# Patient Record
Sex: Male | Born: 1975 | Race: White | Hispanic: No | Marital: Married | Smoking: Never smoker
Health system: Southern US, Community
[De-identification: ages and names within clinical notes are randomized; demographics above are authoritative.]

## PROBLEM LIST (undated history)

## (undated) DIAGNOSIS — J069 Acute upper respiratory infection, unspecified: Secondary | ICD-10-CM

## (undated) DIAGNOSIS — J45909 Unspecified asthma, uncomplicated: Secondary | ICD-10-CM

## (undated) DIAGNOSIS — E789 Disorder of lipoprotein metabolism, unspecified: Secondary | ICD-10-CM

## (undated) DIAGNOSIS — G4733 Obstructive sleep apnea (adult) (pediatric): Secondary | ICD-10-CM

## (undated) DIAGNOSIS — L309 Dermatitis, unspecified: Secondary | ICD-10-CM

## (undated) DIAGNOSIS — T7840XA Allergy, unspecified, initial encounter: Secondary | ICD-10-CM

## (undated) HISTORY — DX: Acute upper respiratory infection, unspecified: J06.9

## (undated) HISTORY — PX: OTHER SURGICAL HISTORY: SHX169

## (undated) HISTORY — PX: SINOSCOPY: SHX187

## (undated) HISTORY — DX: Obstructive sleep apnea (adult) (pediatric): G47.33

## (undated) HISTORY — DX: Unspecified asthma, uncomplicated: J45.909

## (undated) HISTORY — DX: Allergy, unspecified, initial encounter: T78.40XA

## (undated) HISTORY — DX: Dermatitis, unspecified: L30.9

---

## 2015-03-08 ENCOUNTER — Encounter (INDEPENDENT_AMBULATORY_CARE_PROVIDER_SITE_OTHER): Payer: Self-pay

## 2015-03-08 ENCOUNTER — Encounter: Payer: Self-pay | Admitting: Pulmonary Disease

## 2015-03-08 ENCOUNTER — Ambulatory Visit (INDEPENDENT_AMBULATORY_CARE_PROVIDER_SITE_OTHER): Payer: 59 | Admitting: Pulmonary Disease

## 2015-03-08 ENCOUNTER — Other Ambulatory Visit: Payer: 59

## 2015-03-08 ENCOUNTER — Ambulatory Visit (INDEPENDENT_AMBULATORY_CARE_PROVIDER_SITE_OTHER)
Admission: RE | Admit: 2015-03-08 | Discharge: 2015-03-08 | Disposition: A | Discharge status: Home / Self Care | Payer: 59 | Source: Ambulatory Visit | Attending: Pulmonary Disease | Admitting: Pulmonary Disease

## 2015-03-08 VITALS — BP 122/76 | HR 87 | Temp 98.0°F | Ht 69.0 in | Wt 186.0 lb

## 2015-03-08 DIAGNOSIS — J329 Chronic sinusitis, unspecified: Secondary | ICD-10-CM

## 2015-03-08 DIAGNOSIS — J4540 Moderate persistent asthma, uncomplicated: Secondary | ICD-10-CM | POA: Insufficient documentation

## 2015-03-08 DIAGNOSIS — J4520 Mild intermittent asthma, uncomplicated: Secondary | ICD-10-CM

## 2015-03-08 NOTE — Assessment & Plan Note (Signed)
The patient has a lifelong history of asthma which has been reasonably controlled in the past. He has moved here from [place], and the past one month has seen increasing symptoms with increasing use of albuterol. He has a lot of issues with chronic sinusitis, and just recently finished a course of antibiotics. He did not have a lot of allergies while living in [place] except for dust mites, but given his history I think that we should do a RAST panel specific to [place] for further evaluation. I would like to start with the basics at this point, and I have asked the patient to take his dulera 2 puffs twice a day rather than once a day. Depending upon his Rast test results, and how he does over time, he may need more aggressive treatment for allergies. I stressed to him the importance of close follow-up with otolaryngology, given that his chronic sinus disease is often the trigger for his asthma flares. The good news here is that we are starting from a normal baseline with respect to his spirometry.

## 2015-03-08 NOTE — Progress Notes (Signed)
   Subjective:    Patient ID: Collin Cabrera, male    DOB: 1976/03/14, 39 y.o.   MRN: [id number]  HPI The patient is a 39 year old male who I've been asked to see for management of lifelong asthma. The patient recently moved here from [place], and tells me that he has had asthma since age 3. Overall, it is been fairly well-controlled over the years, and he has never been hospitalized or intubated in the past. He has maintained on dulera once a day, but the last one month he has had increasing symptoms and increased use of albuterol for rescue. He also is finishing up antibiotics for a recent sinus infection. The patient tells me that he has excellent exertional tolerance, and exercises on a regular basis. He has not had a recent chest x-ray or spirometry. He has had allergy testing on multiple occasions, with only dust mites being an issue. He has taken allergy vaccine for dust mites, and feels that it really didn't help his breathing. He has a history of chronic sinusitis, along with recurrent episodes of acute sinusitis. This is frequently a flare for his asthma. He has been diagnosed with nasal polyps, and has had surgery for treatment. He is currently scheduled to see otolaryngology in [place]. He tells me that he has 3-4 sinus infections a year, and needs by mouth prednisone about 2 times a year. He has been tried on Singulair in the past and did not feel it helped him. He denies any reflux symptoms or chronic cough. His typical albuterol use is about one time per 2 weeks.   Review of Systems  Constitutional: Negative for fever, chills, activity change, appetite change and unexpected weight change.  HENT: Positive for congestion, ear pain, postnasal drip and sore throat. Negative for dental problem, nosebleeds, rhinorrhea, sinus pressure, sneezing, trouble swallowing and voice change.   Eyes: Negative for redness, itching and visual disturbance.  Respiratory: Positive for cough, chest tightness,  shortness of breath and wheezing. Negative for choking.   Cardiovascular: Negative for chest pain, palpitations and leg swelling.  Gastrointestinal: Negative for nausea, vomiting and abdominal pain.  Genitourinary: Negative for dysuria and difficulty urinating.  Musculoskeletal: Negative for joint swelling and arthralgias.  Skin: Negative for rash.  Neurological: Negative for headaches.  Hematological: Does not bruise/bleed easily.  Psychiatric/Behavioral: Positive for dysphoric mood. Negative for behavioral problems and confusion. The patient is nervous/anxious.        Objective:   Physical Exam Constitutional:  Well developed, no acute distress  HENT:  Nares patent without discharge, large turbinates.  Oropharynx without exudate, palate and uvula are normal  Eyes:  Perrla, eomi, no scleral icterus  Neck:  No JVD, no TMG  Cardiovascular:  Normal rate, regular rhythm, no rubs or gallops.  No murmurs        Intact distal pulses  Pulmonary :  Normal breath sounds, no stridor or respiratory distress   No rales, rhonchi, or wheezing  Abdominal:  Soft, nondistended, bowel sounds present.  No tenderness noted.   Musculoskeletal:  No lower extremity edema noted.  Lymph Nodes:  No cervical lymphadenopathy noted  Skin:  No cyanosis noted  Neurologic:  Alert, appropriate, moves all 4 extremities without obvious deficit.         Assessment & Plan:

## 2015-03-08 NOTE — Patient Instructions (Signed)
Increase dulera to 2 puffs am and pm everyday. Keep close followup with your ENT for your chronic sinus disease. Will check chest xray today as well as RAST testing for allergies.  Will call you with results. Would like to see you back in 88mos just to see how things are going, then can spread out to every 37mos.  However, would like to hear from you if not doing a lot better with increased dulera dosing.

## 2015-03-08 NOTE — Assessment & Plan Note (Signed)
The patient has a history of chronic sinusitis with recurrent episodes of acute sinusitis. This is normally the trigger for his acute asthma flares. He is scheduled to see an otolaryngologist in [place], and I have stressed to him the importance of keeping close contact with him.

## 2015-03-09 ENCOUNTER — Telehealth: Payer: Self-pay | Admitting: Pulmonary Disease

## 2015-03-09 LAB — ALLERGY FULL PROFILE
Allergen, D pternoyssinus,d7: 7.65 kU/L — ABNORMAL HIGH
Allergen,Goose feathers, e70: <0.1 kU/L
Alternaria Alternata: <0.1 kU/L
Aspergillus fumigatus, m3: <0.1 kU/L
Bermuda Grass: <0.1 kU/L
Candida Albicans: 0.1 kU/L — ABNORMAL HIGH
Common Ragweed: <0.1 kU/L
D. farinae: 5.74 kU/L — ABNORMAL HIGH
Dog Dander: <0.1 kU/L
G009 Red Top: <0.1 kU/L
Helminthosporium halodes: <0.1 kU/L
House Dust Hollister: 0.39 kU/L — ABNORMAL HIGH
IgE (Immunoglobulin E), Serum: 63 kU/L (ref <115)
Lamb's Quarters: <0.1 kU/L
Plantain: <0.1 kU/L
Stemphylium Botryosum: <0.1 kU/L
Sycamore Tree: <0.1 kU/L

## 2015-03-09 NOTE — Telephone Encounter (Signed)
Notes Recorded by Kathee Delton, MD on 03/09/2015 at 3:22 PM Let pt know that his allergy testing only showed significant reactions to dust mites. His IgE level was also fairly low, which looks at "allergy level" of the body overall.  Would see how he does on the higher dose of dulera. If issues continue, we can look at other things ---------------------------------------- Spoke with pt, he is aware of results.

## 2015-03-09 NOTE — Progress Notes (Signed)
Quick Note:  Pt aware of results. ______ 

## 2015-05-08 ENCOUNTER — Encounter: Payer: Self-pay | Admitting: Pulmonary Disease

## 2015-05-08 ENCOUNTER — Ambulatory Visit (INDEPENDENT_AMBULATORY_CARE_PROVIDER_SITE_OTHER): Payer: 59 | Admitting: Pulmonary Disease

## 2015-05-08 VITALS — BP 114/70 | HR 60 | Temp 98.0°F | Ht 69.0 in | Wt 169.2 lb

## 2015-05-08 DIAGNOSIS — J4540 Moderate persistent asthma, uncomplicated: Secondary | ICD-10-CM

## 2015-05-08 NOTE — Patient Instructions (Signed)
Stay on dulera, but can increase dose to twice a day if having a bad stretch. followup in one year with Dr. Lake Bells.

## 2015-05-08 NOTE — Progress Notes (Signed)
   Subjective:    Patient ID: Collin Cabrera, male    DOB: Jun 19, 1976, 39 y.o.   MRN: [id number]  HPI The patient comes in today for follow-up of his known asthma. He has been taking dulera on a daily basis, but only one time a day. He feels that his breathing is doing very well currently, and he has not required a rescue inhaler. I have reviewed his Rast testing with him, and surprisingly he only had a reaction to dust mites with a low IgE level. He does have a history of chronic sinusitis, and is followed by otolaryngology.  He is currently very satisfied with his asthma control.   Review of Systems  Constitutional: Negative for fever and unexpected weight change.  HENT: Negative for congestion, dental problem, ear pain, nosebleeds, postnasal drip, rhinorrhea, sinus pressure, sneezing, sore throat and trouble swallowing.   Eyes: Negative for redness and itching.  Respiratory: Negative for cough, chest tightness, shortness of breath and wheezing.   Cardiovascular: Negative for palpitations and leg swelling.  Gastrointestinal: Negative for nausea and vomiting.  Genitourinary: Negative for dysuria.  Musculoskeletal: Negative for joint swelling.  Skin: Negative for rash.  Neurological: Negative for headaches.  Hematological: Does not bruise/bleed easily.  Psychiatric/Behavioral: Negative for dysphoric mood. The patient is not nervous/anxious.        Objective:   Physical Exam Well-developed male in no acute distress Nose without purulence or discharge noted Neck without lymphadenopathy or thyromegaly Chest totally clear to auscultation, no wheezing Cardiac exam with regular rate and rhythm Lower extremities without edema, no cyanosis Alert and oriented, moves all 4 extremities.        Assessment & Plan:

## 2015-05-08 NOTE — Assessment & Plan Note (Signed)
The patient currently is doing very well on once a day dulera, and is satisfied with his current control. I am okay with him staying on once a day medication provided that he is doing well. He understands that he can increase this to twice a day if having increased symptoms. He will follow-up in one year if doing well.

## 2015-07-02 ENCOUNTER — Telehealth: Payer: Self-pay | Admitting: Pulmonary Disease

## 2015-07-02 NOTE — Telephone Encounter (Signed)
Rec'd records from [facility] [place] [facility] page to [facility]

## 2017-02-12 DIAGNOSIS — J209 Acute bronchitis, unspecified: Secondary | ICD-10-CM | POA: Diagnosis not present

## 2017-02-12 DIAGNOSIS — J019 Acute sinusitis, unspecified: Secondary | ICD-10-CM | POA: Diagnosis not present

## 2017-02-17 ENCOUNTER — Encounter: Payer: Self-pay | Admitting: Allergy and Immunology

## 2017-02-17 ENCOUNTER — Ambulatory Visit (INDEPENDENT_AMBULATORY_CARE_PROVIDER_SITE_OTHER): Payer: 59 | Admitting: Allergy and Immunology

## 2017-02-17 VITALS — BP 112/78 | HR 64 | Temp 98.4°F | Resp 16 | Ht 68.5 in | Wt 183.0 lb

## 2017-02-17 DIAGNOSIS — J45901 Unspecified asthma with (acute) exacerbation: Secondary | ICD-10-CM

## 2017-02-17 DIAGNOSIS — J3089 Other allergic rhinitis: Secondary | ICD-10-CM | POA: Diagnosis not present

## 2017-02-17 DIAGNOSIS — J320 Chronic maxillary sinusitis: Secondary | ICD-10-CM

## 2017-02-17 DIAGNOSIS — Z8709 Personal history of other diseases of the respiratory system: Secondary | ICD-10-CM | POA: Diagnosis not present

## 2017-02-17 MED ORDER — PREDNISONE 1 MG PO TABS
10.0000 mg | ORAL_TABLET | Freq: Every day | ORAL | Status: DC
Start: 2017-02-18 — End: 2019-03-22

## 2017-02-17 MED ORDER — BECLOMETHASONE DIPROPIONATE 80 MCG/ACT NA AERS: 1.0000 | INHALATION_SPRAY | Freq: Every day | NASAL | 5 refills | Status: DC | PRN

## 2017-02-17 NOTE — Assessment & Plan Note (Addendum)
Status post polypectomy.  Qnasl has been prescribed (as above).

## 2017-02-17 NOTE — Progress Notes (Signed)
New Patient Note  RE: Collin Cabrera MRN: [id number] DOB: Mar 06, 1976 Date of Office Visit: 02/17/2017  Referring provider: Barrie Lyme, FNP Primary care provider: Barrie Lyme, FNP  Chief Complaint: Asthma; Allergic Rhinitis ; and Sinus Problem   History of present illness: Collin Cabrera is a 41 y.o. male seen today in consultation requested by Gwenlyn Perking, FNP. He reports that he has had asthma and allergic rhinitis since he was approximately 41 years of age.  Ten years ago he started aeroallergen immunotherapy, primarily for dust mite hypersensitivity, and he reports that his upper and lower respiratory symptoms improved significantly.  He moved from [place] to [place] 3 years ago and states that his asthma and allergy symptoms have been relatively well-controlled until approximately 1 month ago.  At that time, he developed a sinus infection requiring azithromycin and prednisone.  His sinus symptoms improved, however he is still experiencing coughing, dyspnea, and chest tightness.  He has been requiring albuterol rescue on a daily basis as well as having nocturnal awakenings due to lower respiratory symptoms over the past 2 weeks.  He notes that he acquired a dog 1 month ago.  He does not allow the dog in the bedroom but is concerned that he may be allergic to dog dander and would like confirmation.  He currently takes Dulera 200-5 g, 2 inhalations once daily, albuterol as needed, levocetirizine daily, and fluticasone nasal spray as needed.  He currently does not use a spacer device with his HFA inhalers. He has a history of nasal polyps, status post polypectomy a few year ago. He currently does not take an intranasal steroid on a daily basis.   Assessment and plan: Asthma with acute exacerbation  Prednisone has been provided, 40 mg x3 days, 20 mg x1 day, 10 mg x1 day, then stop.  For now, increase Dulera 200-5 g to 2 inhalations twice a day.  To maximize pulmonary  deposition, a spacer has been provided along with instructions for its proper administration with an HFA inhaler.  Continue albuterol HFA, 1-2 inhalations every 4-6 hours as needed.  The patient has been asked to contact me if his symptoms persist or progress.  Other allergic rhinitis We were unable to perform skin tests today due to recent administration of antihistamine.   The patient is scheduled to return in 3 weeks for allergy skin testing after having been off of antihistamines for at least 3 days.    To control symptoms while off of antihistamines, prednisone has been provided: 10g per day for 3 days.  A sample and prescription have been provided for Qnasl 80 g, one actuation per nostril twice daily as needed.  Proper technique has been discussed and demonstrated.  I have also recommended nasal saline spray (i.e., Simply Saline) or nasal saline lavage (i.e., NeilMed) as needed prior to medicated nasal sprays.  Further recommendations will be made at that time based upon skin test results.  Sinusitis, chronic  Treatment plan as outlined above for allergic rhinitis.  For thick post nasal drainage, nasal congestion, and/or sinus pressure, add guaifenesin [phone] mg (Mucinex) plus/minus pseudoephedrine 60-120 mg  twice daily as needed with adequate hydration as discussed. Pseudoephedrine is only to be used for short-term relief of nasal/sinus congestion. Long-term use is discouraged due to potential side effects.  History of nasal polyp Status post polypectomy.  Qnasl has been prescribed (as above).   Meds ordered this encounter  Medications  . Beclomethasone Dipropionate (QNASL) 80 MCG/ACT  AERS    Sig: Place 1 spray into both nostrils daily as needed.    Dispense:  8.7 g    Refill:  5  . predniSONE (DELTASONE) tablet 10 mg    Diagnostics: Spirometry: FVC was 4.88 L and FEV1 was 4.19 L but 180 mL post-bronchodilator improvement.This study was performed while the patient  was relatively asymptomatic.  Please see scanned spirometry results for details.    Physical examination: Blood pressure 112/78, pulse 64, temperature 98.4 F (36.9 C), temperature source Oral, resp. rate 16, height 5' 8.5" (1.74 m), weight 183 lb (83 kg), SpO2 96 %.  General: Alert, interactive, in no acute distress. HEENT: TMs pearly gray, turbinates moderately edematous without discharge, post-pharynx moderately erythematous. Neck: Supple without lymphadenopathy. Lungs: Clear to auscultation without wheezing, rhonchi or rales. CV: Normal S1, S2 without murmurs. Abdomen: Nondistended, nontender. Skin: Warm and dry, without lesions or rashes. Extremities:  No clubbing, cyanosis or edema. Neuro:   Grossly intact.  Review of systems:  Review of systems negative except as noted in HPI / PMHx or noted below: Review of Systems  Constitutional: Negative.   HENT: Negative.   Eyes: Negative.   Respiratory: Negative.   Cardiovascular: Negative.   Gastrointestinal: Negative.   Genitourinary: Negative.   Musculoskeletal: Negative.   Skin: Negative.   Neurological: Negative.   Endo/Heme/Allergies: Negative.   Psychiatric/Behavioral: Negative.     Past medical history:  Past Medical History:  Diagnosis Date  . Allergy   . Asthma   . Eczema   . OSA (obstructive sleep apnea)   . Recurrent upper respiratory infection (URI)     Past surgical history:  Past Surgical History:  Procedure Laterality Date  . nasal polyps      Family history: Family History  Problem Relation Age of Onset  . Cancer Father   . Allergic rhinitis Neg Hx   . Asthma Neg Hx   . Eczema Neg Hx   . Urticaria Neg Hx   . Immunodeficiency Neg Hx     Social history: Social History   Social History  . Marital status: Unknown    Spouse name: N/A  . Number of children: 2  . Years of education: N/A   Occupational History  . unemployed    Social History Main Topics  . Smoking status: Never Smoker  .  Smokeless tobacco: Never Used  . Alcohol use 2.4 oz/week    4 Glasses of wine per week  . Drug use: No  . Sexual activity: Not on file   Other Topics Concern  . Not on file   Social History Narrative  . No narrative on file   Environmental History: The patient lives in a 40 year old house with carpeting in the bedroom, gas heat, and central air.  He has had a dog for the past month does not allow the dog in to his bedroom.  He is a nonsmoker.  Allergies as of 02/17/2017      Reactions   Dust Mite Extract       Medication List       Accurate as of 02/17/17  5:41 PM. Always use your most recent med list.          Beclomethasone Dipropionate 80 MCG/ACT Aers Commonly known as:  QNASL Place 1 spray into both nostrils daily as needed.   diazepam 5 MG tablet Commonly known as:  VALIUM Take 1 tablet by mouth daily as needed.   DULERA 200-5 MCG/ACT Aero Generic drug:  mometasone-formoterol 2 puffs daily.   fluticasone 50 MCG/ACT nasal spray Commonly known as:  FLONASE Place 1 spray into both nostrils daily.   levocetirizine 5 MG tablet Commonly known as:  XYZAL Once daily   PROAIR HFA 108 (90 Base) MCG/ACT inhaler Generic drug:  albuterol 2 puffs every 6 hrs prn   traZODone 50 MG tablet Commonly known as:  DESYREL Take 50 mg by mouth at bedtime.       Known medication allergies: Allergies  Allergen Reactions  . Dust Mite Extract     I appreciate the opportunity to take part in Mike's care. Please do not hesitate to contact me with questions.  Sincerely,   R. Edgar Frisk, MD

## 2017-02-17 NOTE — Assessment & Plan Note (Signed)
   Treatment plan as outlined above for allergic rhinitis.  For thick post nasal drainage, nasal congestion, and/or sinus pressure, add guaifenesin [phone] mg (Mucinex) plus/minus pseudoephedrine 60-120 mg  twice daily as needed with adequate hydration as discussed. Pseudoephedrine is only to be used for short-term relief of nasal/sinus congestion. Long-term use is discouraged due to potential side effects.

## 2017-02-17 NOTE — Assessment & Plan Note (Signed)
We were unable to perform skin tests today due to recent administration of antihistamine.   The patient is scheduled to return in 3 weeks for allergy skin testing after having been off of antihistamines for at least 3 days.    To control symptoms while off of antihistamines, prednisone has been provided: 10g per day for 3 days.  A sample and prescription have been provided for Qnasl 80 g, one actuation per nostril twice daily as needed.  Proper technique has been discussed and demonstrated.  I have also recommended nasal saline spray (i.e., Simply Saline) or nasal saline lavage (i.e., NeilMed) as needed prior to medicated nasal sprays.  Further recommendations will be made at that time based upon skin test results.

## 2017-02-17 NOTE — Patient Instructions (Addendum)
Asthma with acute exacerbation  Prednisone has been provided, 40 mg x3 days, 20 mg x1 day, 10 mg x1 day, then stop.  For now, increase Dulera 200-5 g to 2 inhalations twice a day.  To maximize pulmonary deposition, a spacer has been provided along with instructions for its proper administration with an HFA inhaler.  Continue albuterol HFA, 1-2 inhalations every 4-6 hours as needed.  The patient has been asked to contact me if his symptoms persist or progress.  Other allergic rhinitis We were unable to perform skin tests today due to recent administration of antihistamine.   The patient is scheduled to return in 3 weeks for allergy skin testing after having been off of antihistamines for at least 3 days.    To control symptoms while off of antihistamines, prednisone has been provided: 10g per day for 3 days.  A sample and prescription have been provided for Qnasl 80 g, one actuation per nostril twice daily as needed.  Proper technique has been discussed and demonstrated.  I have also recommended nasal saline spray (i.e., Simply Saline) or nasal saline lavage (i.e., NeilMed) as needed prior to medicated nasal sprays.  Further recommendations will be made at that time based upon skin test results.  Sinusitis, chronic  Treatment plan as outlined above for allergic rhinitis.  For thick post nasal drainage, nasal congestion, and/or sinus pressure, add guaifenesin [phone] mg (Mucinex) plus/minus pseudoephedrine 60-120 mg  twice daily as needed with adequate hydration as discussed. Pseudoephedrine is only to be used for short-term relief of nasal/sinus congestion. Long-term use is discouraged due to potential side effects.  History of nasal polyp Status post polypectomy.  Qnasl has been prescribed (as above).   Return in about 3 weeks (around 03/10/2017) for allergy skin testing.  Control of Dog or Cat Allergen  Avoidance is the best way to manage a dog or cat allergy. If you have  a dog or cat and are allergic to dog or cats, consider removing the dog or cat from the home. If you have a dog or cat but don't want to find it a new home, or if your family wants a pet even though someone in the household is allergic, here are some strategies that may help keep symptoms at bay:  1. Keep the pet out of your bedroom and restrict it to only a few rooms. Be advised that keeping the dog or cat in only one room will not limit the allergens to that room. 2. Don't pet, hug or kiss the dog or cat; if you do, wash your hands with soap and water. 3. High-efficiency particulate air (HEPA) cleaners run continuously in a bedroom or living room can reduce allergen levels over time. 4. Regular use of a high-efficiency vacuum cleaner or a central vacuum can reduce allergen levels. 5. Giving your dog or cat a bath at least once a week can reduce airborne allergen.  Control of House Dust Mite Allergen  House dust mites play a major role in allergic asthma and rhinitis.  They occur in environments with high humidity wherever human skin, the food for dust mites is found. High levels have been detected in dust obtained from mattresses, pillows, carpets, upholstered furniture, bed covers, clothes and soft toys.  The principal allergen of the house dust mite is found in its feces.  A gram of dust may contain 1,000 mites and 250,000 fecal particles.  Mite antigen is easily measured in the air during house cleaning activities.  1. Encase mattresses, including the box spring, and pillow, in an air tight cover.  Seal the zipper end of the encased mattresses with wide adhesive tape. 2. Wash the bedding in water of 130 degrees Farenheit weekly.  Avoid cotton comforters/quilts and flannel bedding: the most ideal bed covering is the dacron comforter. 3. Remove all upholstered furniture from the bedroom. 4. Remove carpets, carpet padding, rugs, and non-washable window drapes from the bedroom.  Wash drapes  weekly or use plastic window coverings. 5. Remove all non-washable stuffed toys from the bedroom.  Wash stuffed toys weekly. 6. Have the room cleaned frequently with a vacuum cleaner and a damp dust-mop.  The patient should not be in a room which is being cleaned and should wait 1 hour after cleaning before going into the room. 7. Close and seal all heating outlets in the bedroom.  Otherwise, the room will become filled with dust-laden air.  An electric heater can be used to heat the room. 8. Reduce indoor humidity to less than 50%.  Do not use a humidifier.

## 2017-02-17 NOTE — Assessment & Plan Note (Signed)
   Prednisone has been provided, 40 mg x3 days, 20 mg x1 day, 10 mg x1 day, then stop.  For now, increase Dulera 200-5 g to 2 inhalations twice a day.  To maximize pulmonary deposition, a spacer has been provided along with instructions for its proper administration with an HFA inhaler.  Continue albuterol HFA, 1-2 inhalations every 4-6 hours as needed.  The patient has been asked to contact me if his symptoms persist or progress.

## 2017-02-19 ENCOUNTER — Telehealth: Payer: Self-pay

## 2017-02-19 MED ORDER — CICLESONIDE 37 MCG/ACT NA AERS: 1.0000 | INHALATION_SPRAY | Freq: Every day | NASAL | 5 refills | Status: DC | PRN

## 2017-02-19 NOTE — Telephone Encounter (Signed)
We received a fax from [facility] in [facility] asking for an alternative Qnasl 80.  Please Advise.

## 2017-02-19 NOTE — Telephone Encounter (Signed)
How about Collin Cabrera?

## 2017-02-19 NOTE — Addendum Note (Signed)
Addended by: Martyn Malay on: 02/19/2017 01:27 PM   Modules accepted: Orders

## 2017-02-19 NOTE — Telephone Encounter (Signed)
Script sent in

## 2017-02-20 ENCOUNTER — Telehealth: Payer: Self-pay | Admitting: *Deleted

## 2017-02-20 NOTE — Telephone Encounter (Signed)
error 

## 2017-06-03 DIAGNOSIS — K219 Gastro-esophageal reflux disease without esophagitis: Secondary | ICD-10-CM | POA: Diagnosis not present

## 2017-07-18 DIAGNOSIS — R140 Abdominal distension (gaseous): Secondary | ICD-10-CM | POA: Diagnosis not present

## 2017-07-18 DIAGNOSIS — G479 Sleep disorder, unspecified: Secondary | ICD-10-CM | POA: Diagnosis not present

## 2017-08-05 DIAGNOSIS — R110 Nausea: Secondary | ICD-10-CM | POA: Diagnosis not present

## 2017-08-05 DIAGNOSIS — R1030 Lower abdominal pain, unspecified: Secondary | ICD-10-CM | POA: Diagnosis not present

## 2017-08-05 DIAGNOSIS — K219 Gastro-esophageal reflux disease without esophagitis: Secondary | ICD-10-CM | POA: Diagnosis not present

## 2017-08-06 ENCOUNTER — Other Ambulatory Visit: Payer: Self-pay | Admitting: Family Medicine

## 2017-08-06 DIAGNOSIS — Z23 Encounter for immunization: Secondary | ICD-10-CM | POA: Diagnosis not present

## 2017-08-06 DIAGNOSIS — R1030 Lower abdominal pain, unspecified: Secondary | ICD-10-CM

## 2017-08-13 ENCOUNTER — Ambulatory Visit (HOSPITAL_BASED_OUTPATIENT_CLINIC_OR_DEPARTMENT_OTHER)
Admission: RE | Admit: 2017-08-13 | Discharge: 2017-08-13 | Disposition: A | Discharge status: Home / Self Care | Payer: 59 | Source: Ambulatory Visit | Attending: Family Medicine | Admitting: Family Medicine

## 2017-08-13 ENCOUNTER — Encounter (HOSPITAL_BASED_OUTPATIENT_CLINIC_OR_DEPARTMENT_OTHER): Payer: Self-pay

## 2017-08-13 DIAGNOSIS — R1030 Lower abdominal pain, unspecified: Secondary | ICD-10-CM | POA: Diagnosis present

## 2017-08-13 DIAGNOSIS — R109 Unspecified abdominal pain: Secondary | ICD-10-CM | POA: Diagnosis not present

## 2017-08-13 MED ORDER — IOPAMIDOL (ISOVUE-300) INJECTION 61%
100.0000 mL | Freq: Once | INTRAVENOUS | Status: AC | PRN
  Administered 2017-08-13: 100 mL via INTRAVENOUS

## 2017-09-01 DIAGNOSIS — R110 Nausea: Secondary | ICD-10-CM | POA: Diagnosis not present

## 2017-09-01 DIAGNOSIS — R198 Other specified symptoms and signs involving the digestive system and abdomen: Secondary | ICD-10-CM | POA: Diagnosis not present

## 2017-09-01 DIAGNOSIS — R140 Abdominal distension (gaseous): Secondary | ICD-10-CM | POA: Diagnosis not present

## 2017-10-06 DIAGNOSIS — Z23 Encounter for immunization: Secondary | ICD-10-CM | POA: Diagnosis not present

## 2017-10-18 DIAGNOSIS — K056 Periodontal disease, unspecified: Secondary | ICD-10-CM | POA: Diagnosis not present

## 2018-03-13 DIAGNOSIS — J112 Influenza due to unidentified influenza virus with gastrointestinal manifestations: Secondary | ICD-10-CM | POA: Diagnosis not present

## 2018-03-15 DIAGNOSIS — Z91018 Allergy to other foods: Secondary | ICD-10-CM | POA: Diagnosis not present

## 2018-03-15 DIAGNOSIS — R748 Abnormal levels of other serum enzymes: Secondary | ICD-10-CM | POA: Diagnosis not present

## 2018-03-18 ENCOUNTER — Other Ambulatory Visit: Payer: Self-pay | Admitting: Family Medicine

## 2018-03-18 DIAGNOSIS — R109 Unspecified abdominal pain: Secondary | ICD-10-CM

## 2018-03-18 DIAGNOSIS — R748 Abnormal levels of other serum enzymes: Secondary | ICD-10-CM

## 2018-03-20 DIAGNOSIS — J019 Acute sinusitis, unspecified: Secondary | ICD-10-CM | POA: Diagnosis not present

## 2018-03-21 ENCOUNTER — Ambulatory Visit (HOSPITAL_BASED_OUTPATIENT_CLINIC_OR_DEPARTMENT_OTHER): Payer: 59

## 2018-03-22 ENCOUNTER — Other Ambulatory Visit (HOSPITAL_BASED_OUTPATIENT_CLINIC_OR_DEPARTMENT_OTHER): Payer: 59

## 2018-03-23 ENCOUNTER — Ambulatory Visit (HOSPITAL_BASED_OUTPATIENT_CLINIC_OR_DEPARTMENT_OTHER): Payer: 59

## 2018-03-25 ENCOUNTER — Encounter (HOSPITAL_BASED_OUTPATIENT_CLINIC_OR_DEPARTMENT_OTHER): Payer: Self-pay

## 2018-03-25 ENCOUNTER — Ambulatory Visit (HOSPITAL_BASED_OUTPATIENT_CLINIC_OR_DEPARTMENT_OTHER)
Admission: RE | Admit: 2018-03-25 | Discharge: 2018-03-25 | Disposition: A | Discharge status: Home / Self Care | Payer: 59 | Source: Ambulatory Visit | Attending: Family Medicine | Admitting: Family Medicine

## 2018-03-25 DIAGNOSIS — R748 Abnormal levels of other serum enzymes: Secondary | ICD-10-CM | POA: Diagnosis present

## 2018-03-25 DIAGNOSIS — R7989 Other specified abnormal findings of blood chemistry: Secondary | ICD-10-CM | POA: Diagnosis not present

## 2018-03-25 DIAGNOSIS — R109 Unspecified abdominal pain: Secondary | ICD-10-CM | POA: Insufficient documentation

## 2018-03-25 HISTORY — DX: Disorder of lipoprotein metabolism, unspecified: E78.9

## 2018-09-06 DIAGNOSIS — Z3009 Encounter for other general counseling and advice on contraception: Secondary | ICD-10-CM | POA: Diagnosis not present

## 2018-11-06 DIAGNOSIS — J019 Acute sinusitis, unspecified: Secondary | ICD-10-CM | POA: Diagnosis not present

## 2018-12-09 ENCOUNTER — Other Ambulatory Visit (HOSPITAL_BASED_OUTPATIENT_CLINIC_OR_DEPARTMENT_OTHER): Payer: Self-pay | Admitting: Family Medicine

## 2018-12-09 DIAGNOSIS — D1803 Hemangioma of intra-abdominal structures: Secondary | ICD-10-CM

## 2019-01-12 IMAGING — US US ABDOMEN COMPLETE
1 series · 13 of 25 positions shown · non-contrast
Comparison: CT 08/13/2017

CLINICAL DATA: Elevated liver function tests.

EXAM:
ABDOMEN ULTRASOUND COMPLETE

[Series 1: us abdomen complete · 0.31mm/px · 13 of 115 slices shown]
[im 1/115]
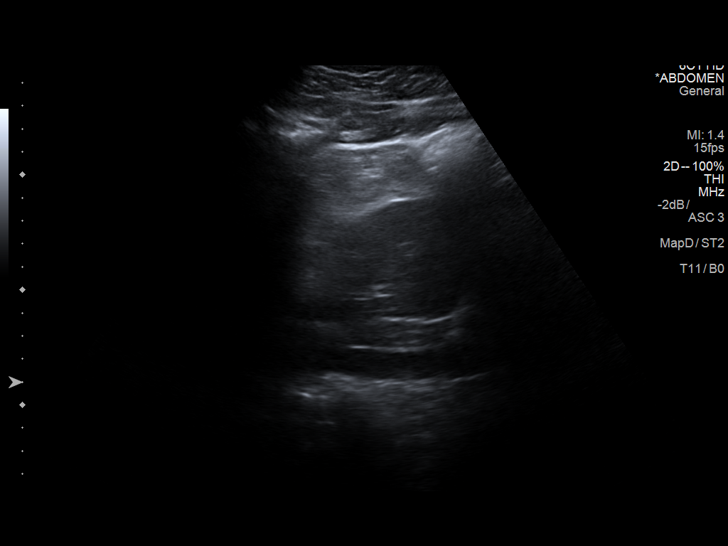
[im 10/115]
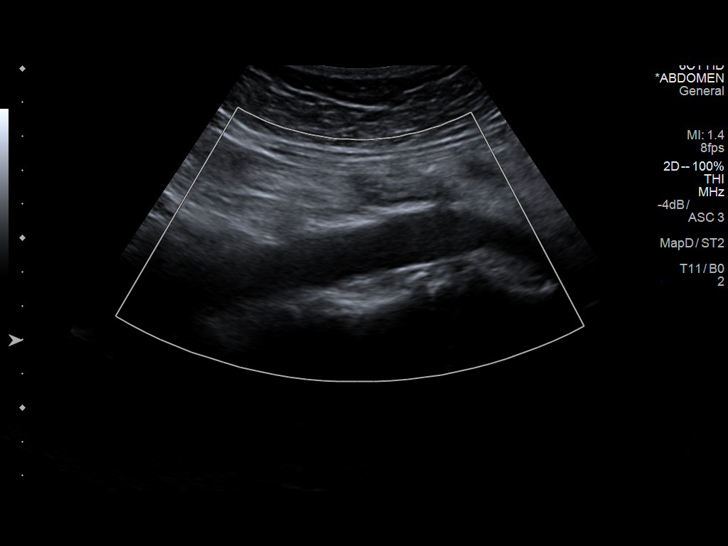
[im 20/115]
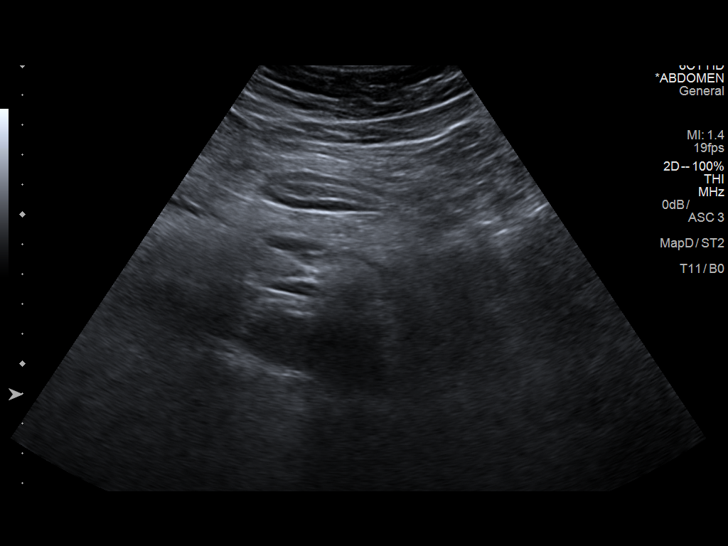
[im 29/115]
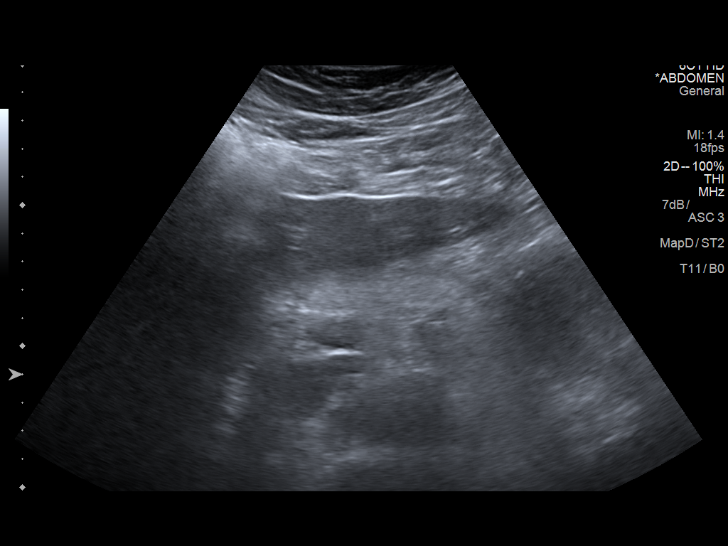
[im 39/115]
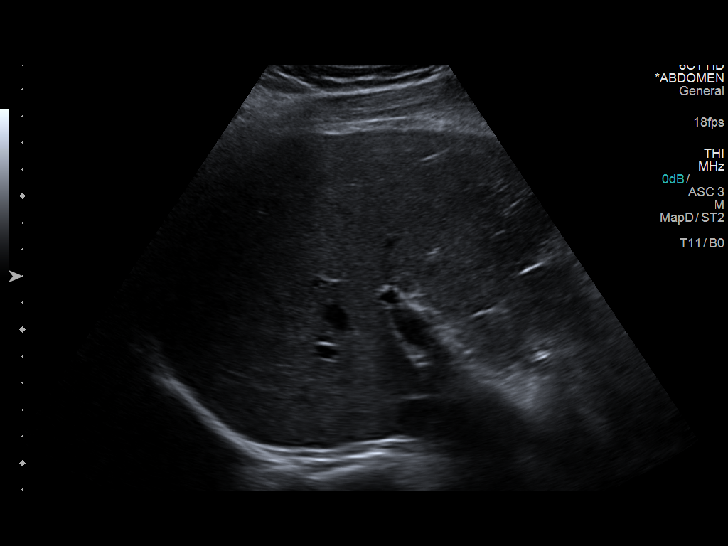
[im 48/115]
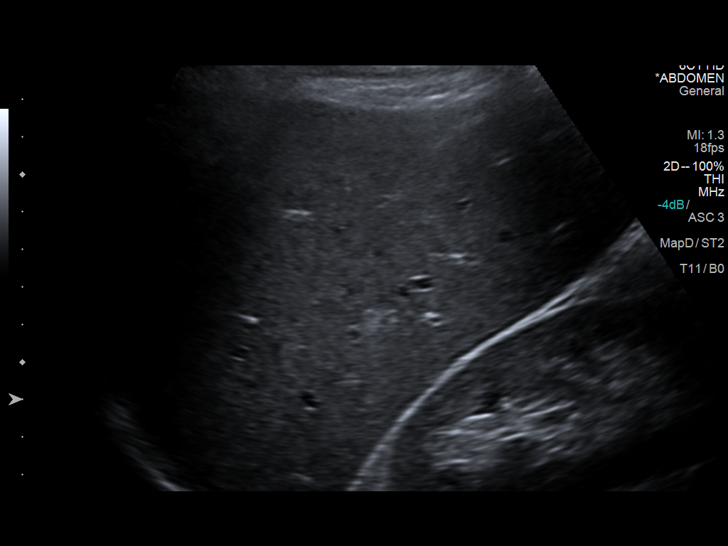
[im 58/115]
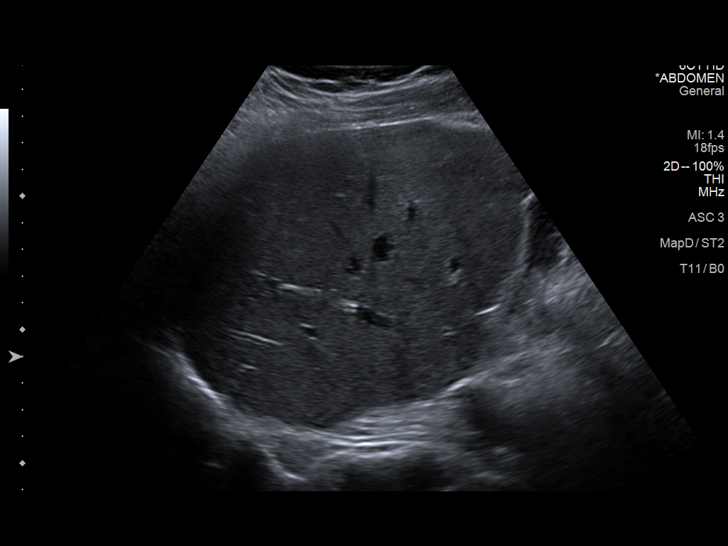
[im 67/115]
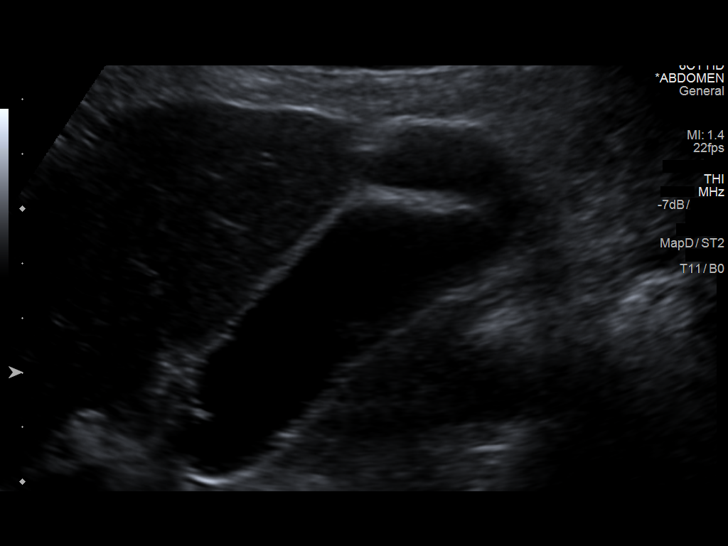
[im 77/115]
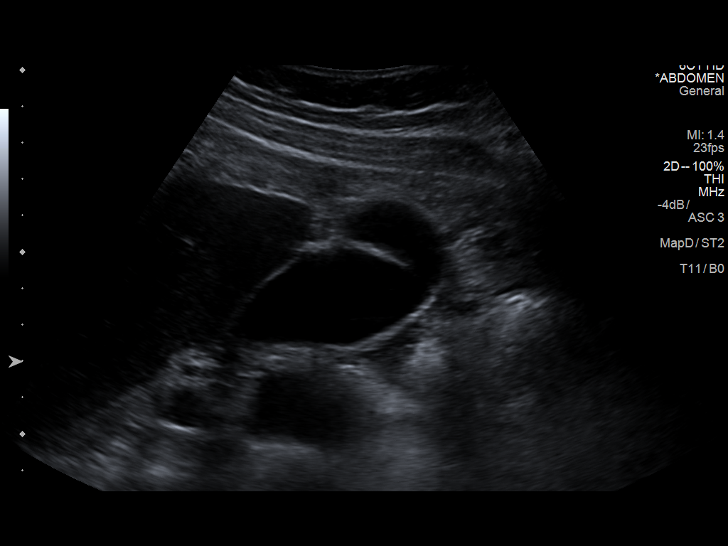
[im 86/115]
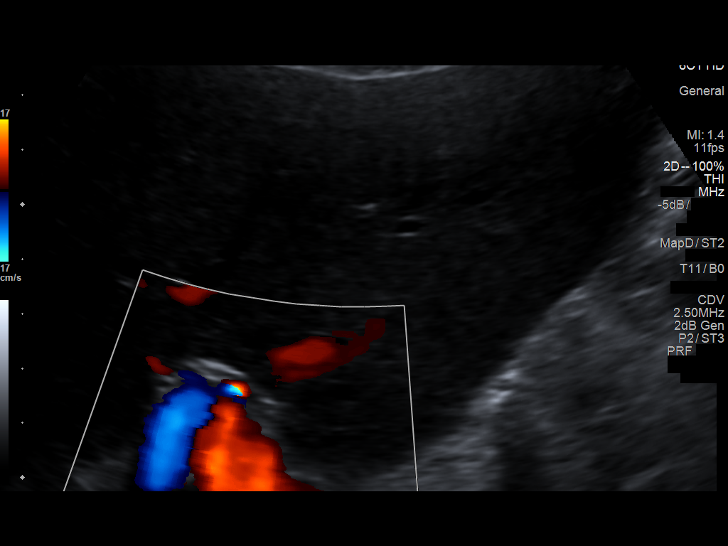
[im 96/115]
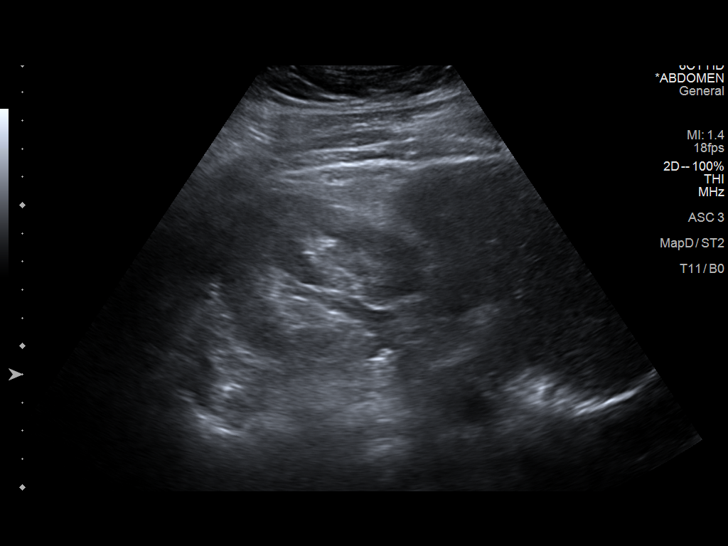
[im 105/115]
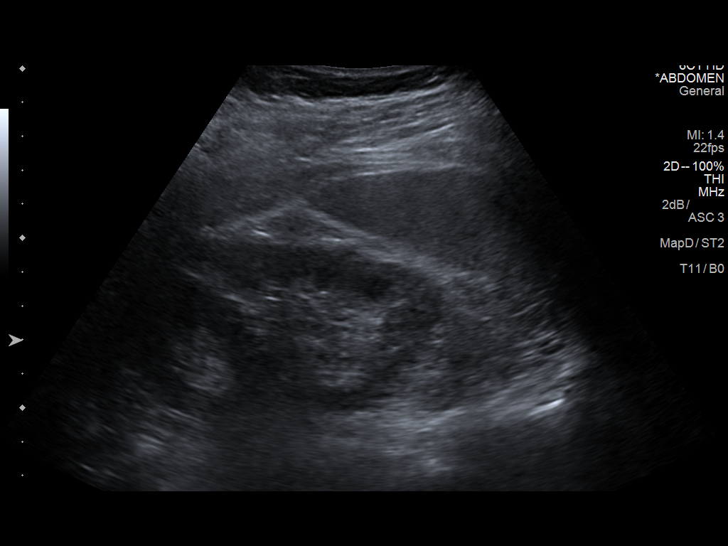
[im 115/115]
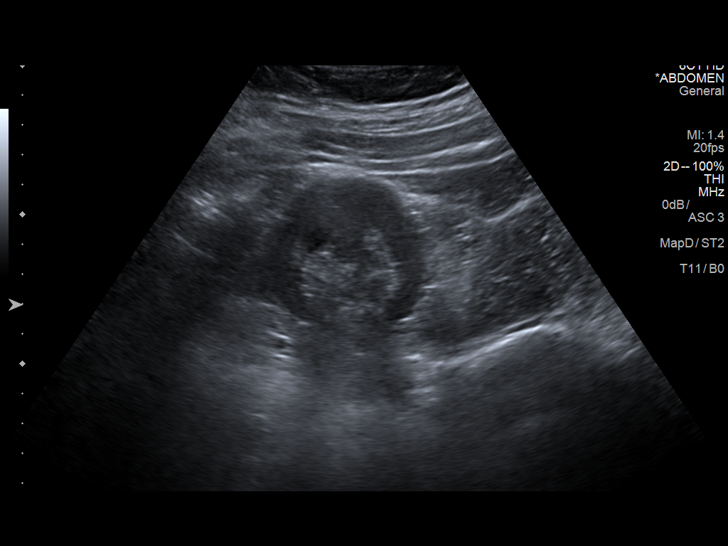

[13 of 25 positions shown; findings below may reference images not displayed]

FINDINGS: Gallbladder: No gallstones or wall thickening visualized. No
sonographic Murphy sign noted by sonographer.

Common bile duct: Diameter: normal, 2 mm.

Liver: Normal hepatic echogenicity. Within the right lobe, a subtle
probable hyperechoic lesion is identified at 1.1 cm. No correlate on
the prior CT. No evidence of cirrhosis. Portal vein is patent on
color Doppler imaging with normal direction of blood flow towards
the liver.

IVC: No abnormality visualized.

Pancreas: Visualized portion unremarkable.

Spleen: Size and appearance within normal limits.

Right Kidney: Length: 12.4 cm. Echogenicity within normal limits. No
mass or hydronephrosis visualized.

Left Kidney: Length: 11.8 cm. Echogenicity within normal limits. No
mass or hydronephrosis visualized.

Abdominal aorta: No aneurysm visualized.

Other findings: No ascites.
IMPRESSION: 1. No acute process or explanation for elevated liver function
tests.
2. Probable hyperechoic right liver lobe lesion. Not readily
apparent on the prior CT. Most likely a hemangioma. If there is any
history of primary malignancy, or known liver disease, recommend
further evaluation with dedicated pre and post contrast abdominal
MRI. If no such history, potential clinical strategies include
ultrasound follow-up at 3 months or a more aggressive approach of
pre and post contrast abdominal MRI.

## 2019-02-15 ENCOUNTER — Ambulatory Visit (INDEPENDENT_AMBULATORY_CARE_PROVIDER_SITE_OTHER): Payer: 59 | Admitting: Allergy & Immunology

## 2019-02-15 ENCOUNTER — Other Ambulatory Visit: Payer: Self-pay | Admitting: Allergy & Immunology

## 2019-02-15 ENCOUNTER — Encounter: Payer: Self-pay | Admitting: Allergy & Immunology

## 2019-02-15 VITALS — BP 122/82 | HR 72 | Resp 16 | Ht 69.0 in | Wt 193.0 lb

## 2019-02-15 DIAGNOSIS — J4530 Mild persistent asthma, uncomplicated: Secondary | ICD-10-CM | POA: Diagnosis not present

## 2019-02-15 DIAGNOSIS — J310 Chronic rhinitis: Secondary | ICD-10-CM | POA: Diagnosis not present

## 2019-02-15 DIAGNOSIS — J4531 Mild persistent asthma with (acute) exacerbation: Secondary | ICD-10-CM | POA: Diagnosis not present

## 2019-02-15 MED ORDER — BUDESONIDE-FORMOTEROL FUMARATE 160-4.5 MCG/ACT IN AERO: 2.0000 | INHALATION_SPRAY | Freq: Two times a day (BID) | RESPIRATORY_TRACT | 5 refills | Status: DC

## 2019-02-15 MED ORDER — FLUTICASONE PROPIONATE 50 MCG/ACT NA SUSP: 1.0000 | Freq: Every day | NASAL | 3 refills | Status: AC

## 2019-02-15 MED ORDER — DULERA 200-5 MCG/ACT IN AERO: 2.0000 | INHALATION_SPRAY | Freq: Every day | RESPIRATORY_TRACT | 3 refills | Status: DC

## 2019-02-15 MED ORDER — PROAIR HFA 108 (90 BASE) MCG/ACT IN AERS: 2.0000 | INHALATION_SPRAY | Freq: Four times a day (QID) | RESPIRATORY_TRACT | 3 refills | Status: DC | PRN

## 2019-02-15 NOTE — Telephone Encounter (Signed)
Dulera changed to Symbicort. Per Dr. Ernst Bowler

## 2019-02-15 NOTE — Patient Instructions (Addendum)
1. Mild persistent asthma, uncomplicated - Lung testing actually looked normal today. - Start the prednisone dose pack provided today. - Restart the Dulera 200/5 two puffs twice daily and continue for two weeks.  - Continue with albuterol 2 puffs every 4-6 hours as needed.   2. Chronic rhinitis (dust mites) - Continue with the Flonase as needed.  3. Return in about 6 months (around 08/16/2019).   Please inform us of any Emergency Department visits, hospitalizations, or changes in symptoms. Call us before going to the ED for breathing or allergy symptoms since we might be able to fit you in for a sick visit. Feel free to contact us anytime with any questions, problems, or concerns.  It was a pleasure to meet you today!  Websites that have reliable patient information: 1. American Academy of Asthma, Allergy, and Immunology: www.aaaai.org 2. Food Allergy Research and Education (FARE): foodallergy.org 3. Mothers of Asthmatics: http://www.asthmacommunitynetwork.org 4. American College of Allergy, Asthma, and Immunology: MonthlyElectricBill.co.uk   Make sure you are registered to vote! If you have moved or changed any of your contact information, you will need to get this updated before voting!    Voter ID laws are NOT going into effect for the General Election in November 2020! DO NOT let this stop you from exercising your right to vote!

## 2019-02-15 NOTE — Progress Notes (Signed)
FOLLOW UP  Date of Service/Encounter:  02/15/19   Assessment:   Mild persistent asthma with acute exacerbation  Chronic rhinitis (dust mites, historically)  Recurrent infections - isolated to the sinuses   Asthma Reportables:  Severity: mild persistent  Risk: high Control: not well controlled   Mr. Hair presents for a sick visit.  He has a history of only showing up when he is sick.  He is on Waller, but only uses it during respiratory flares.  He has been using his rescue inhaler on a nightly basis.  We are going to treat him with prednisone today, and I anticipate that he will not follow-up for another 18 months or so.  I think he would benefit from the use of a daily nasal spray to help prevent sinus infections, but he is not interested in a daily medication.  I did offer for him to follow-up in the [facility] office, and he seemed interested in this.  Plan/Recommendations:   1. Mild persistent asthma, uncomplicated - Lung testing actually looked normal today. - Start the prednisone dose pack provided today. - Restart the Dulera 200/5 two puffs twice daily and continue for two weeks.  - Continue with albuterol 2 puffs every 4-6 hours as needed.   2. Chronic rhinitis (dust mites) - Continue with the Flonase as needed.  3. Return in about 6 months (around 08/16/2019).  Subjective:   Commodore Bellew is a 43 y.o. male presenting today for follow up of  Chief Complaint  Patient presents with  . Asthma    Kamarie Palma has a history of the following: Patient Active Problem List   Diagnosis Date Noted  . Asthma with acute exacerbation 02/17/2017  . Other allergic rhinitis 02/17/2017  . History of nasal polyp 02/17/2017  . Asthma, moderate persistent 03/08/2015  . Sinusitis, chronic 03/08/2015    History obtained from: chart review and patient.  Jaelan is a 43 y.o. male presenting for sick visit. He was last seen in February 2018 as a New Patient. At that time, he  was started on prednisone. His Dulera was increased to 200.5 two puffs BID. He did not undergo allergy testing since he had taken antihistamines. He was started on Mucinex as well as Sudafed for her sinusitis.   Since the last visit he has done fairly well.   Asthma/Respiratory Symptom History: Asthma was been controlled for the most part. He had occasional flare ups. He is unsure of the cause of the flare up today. He did get a dog 18 months ago, but smyptoms did not start until 4 weeks ago. At this point, he is having attacks every night and using his rescue inhaler every night before going to sleep. He was hoping that it would resolve on its own without improvement in his symptoms. He has been using his maintenance inhaler without improvement. Overall he uses his maintenance inhaler only when he has flares. He evidently was prescribed Dulera in his 46s when his asthma was under worse control.   Allergic Rhinitis Symptom History: He will OTC Flonase when he has nasal problems. He does have Afrin that he uses intermittently. He does require antibiotics more than twice per year. He has not tried using nose sprays regularly but he gets nosebleeds. He does have nose bleeds.   Otherwise, there have been no changes to his past medical history, surgical history, family history, or social history.    Review of Systems  Constitutional: Negative.  Negative for fever, malaise/fatigue  and weight loss.  HENT: Negative.  Negative for congestion, ear discharge and ear pain.   Eyes: Negative for pain, discharge and redness.  Respiratory: Positive for cough and shortness of breath. Negative for sputum production and wheezing.   Cardiovascular: Negative.  Negative for chest pain and palpitations.  Gastrointestinal: Negative for abdominal pain, heartburn, nausea and vomiting.  Skin: Negative.  Negative for itching and rash.  Neurological: Negative for dizziness and headaches.  Endo/Heme/Allergies: Positive for  environmental allergies. Does not bruise/bleed easily.       Objective:   Blood pressure 122/82, pulse 72, resp. rate 16, height 5\' 9"  (1.753 m), weight 193 lb (87.5 kg), SpO2 96 %. Body mass index is 28.5 kg/m.   Physical Exam:  Physical Exam  Constitutional: He appears well-developed.  HENT:  Head: Normocephalic and atraumatic.  Right Ear: Tympanic membrane, external ear and ear canal normal.  Left Ear: Tympanic membrane and ear canal normal.  Nose: No mucosal edema, rhinorrhea, nasal deformity or septal deviation. No epistaxis. Right sinus exhibits no maxillary sinus tenderness and no frontal sinus tenderness. Left sinus exhibits no maxillary sinus tenderness and no frontal sinus tenderness.  Mouth/Throat: Uvula is midline and oropharynx is clear and moist. Mucous membranes are not pale and not dry.  Eyes: Pupils are equal, round, and reactive to light. Conjunctivae and EOM are normal. Right eye exhibits no chemosis and no discharge. Left eye exhibits no chemosis and no discharge. Right conjunctiva is not injected. Left conjunctiva is not injected.  Cardiovascular: Normal rate, regular rhythm and normal heart sounds.  Respiratory: Effort normal and breath sounds normal. No accessory muscle usage. No tachypnea. No respiratory distress. He has no wheezes. He has no rhonchi. He has no rales. He exhibits no tenderness.  Overall his lung exam was normal. He did not appear in distress at all.   Lymphadenopathy:    He has no cervical adenopathy.  Neurological: He is alert.  Skin: No abrasion, no petechiae and no rash noted. Rash is not papular, not vesicular and not urticarial. No erythema. No pallor.  Psychiatric: He has a normal mood and affect.     Diagnostic studies:    Spirometry: results normal (FEV1: 3.68/91%, FVC: 4.60/90%, FEV1/FVC: 80%).    Spirometry consistent with normal pattern.   Allergy Studies: none     Salvatore Marvel, MD  [facility] [place]

## 2019-02-16 ENCOUNTER — Other Ambulatory Visit: Payer: Self-pay

## 2019-03-17 ENCOUNTER — Telehealth: Payer: Self-pay

## 2019-03-17 NOTE — Telephone Encounter (Signed)
Dr. Gallagher please advise.  

## 2019-03-17 NOTE — Telephone Encounter (Signed)
Spoke with patient and he is using Dulera twice daily, and Albuterol about 6 times daily at this point. No history of blood clots or pulmonary embolisms. He also forgot to mention that he is currently taking methylprednisone 4 mg for a neck injury. Does you still want him to use the Prednisone? Also scheduled him a telemedicine visit for Tuesday. Please advise.

## 2019-03-17 NOTE — Telephone Encounter (Signed)
I would first make sure that he is using his Dulera two puffs BID. He can also use albuterol four puffs every four hours during the day for the next few days. Prednisone script pended. Please ask him which pharmacy and sign the script on my behalf.   He needs to give [place] a call with an update on Monday at the latest. If symptoms worsen over the weekend with the plan above, he needs to see emergency medical treatment.   Does he have a history of blood clots or pulmonary embolisms?? He seems low risk at this point.   Salvatore Marvel, MD [facility] [place]

## 2019-03-17 NOTE — Telephone Encounter (Signed)
Called and left voicemail asking patient to return call.

## 2019-03-17 NOTE — Telephone Encounter (Signed)
Patient is calling because his asthma has gotten extremely worse in the last week. He is using his rescue inhaler 5 to 6xs a day. He states he can usually run 5 miles but could barely make it down the street today. Patient states he has had a fever of 100.0.  Please Advise

## 2019-03-18 MED ORDER — PREDNISONE 10 MG PO TABS: ORAL_TABLET | ORAL | 0 refills | Status: DC

## 2019-03-18 NOTE — Telephone Encounter (Signed)
Informed patient okay for him to take Prednisone and will speak with Dr. Ernst Bowler on Tuesday.

## 2019-03-18 NOTE — Telephone Encounter (Signed)
Noted - it is fine to take both I think. 4mg  of methylpred is not that much. I look forward to talking to him on Tuesday. If he wants to speak sooner, we could chat today sometime... I think I have openings.  Salvatore Marvel, MD [facility] [place]

## 2019-03-22 ENCOUNTER — Ambulatory Visit (INDEPENDENT_AMBULATORY_CARE_PROVIDER_SITE_OTHER): Payer: 59 | Admitting: Allergy & Immunology

## 2019-03-22 ENCOUNTER — Other Ambulatory Visit: Payer: Self-pay

## 2019-03-22 ENCOUNTER — Encounter: Payer: Self-pay | Admitting: Allergy & Immunology

## 2019-03-22 DIAGNOSIS — R05 Cough: Secondary | ICD-10-CM

## 2019-03-22 DIAGNOSIS — J4531 Mild persistent asthma with (acute) exacerbation: Secondary | ICD-10-CM

## 2019-03-22 DIAGNOSIS — J310 Chronic rhinitis: Secondary | ICD-10-CM | POA: Diagnosis not present

## 2019-03-22 DIAGNOSIS — R059 Cough, unspecified: Secondary | ICD-10-CM

## 2019-03-22 MED ORDER — ALBUTEROL SULFATE (2.5 MG/3ML) 0.083% IN NEBU: 2.5000 mg | INHALATION_SOLUTION | RESPIRATORY_TRACT | 1 refills | Status: AC | PRN

## 2019-03-22 MED ORDER — PROAIR HFA 108 (90 BASE) MCG/ACT IN AERS: 2.0000 | INHALATION_SPRAY | Freq: Four times a day (QID) | RESPIRATORY_TRACT | 1 refills | Status: DC | PRN

## 2019-03-22 NOTE — Progress Notes (Signed)
Start time:  1435 Finish time:  1506 Where are you:  At home My chart:  Link sent (text) Do we have permission to bill your insurance:  yes  Pt states that this the worst his asthma has been.  Has been using his albuterol 2 puffs 4-8 times per day.  Hard to breath, coughing at night, waking out of his sleep.  Currently using the prednisone.  Pt states that he had a fever for the last couple of days.  His temperature was about 100 degrees.  Still feels a little hot at times, feels under the weather, low energy.  Has some temperature variations, "if you want to consider that body chills, then yes". Gave patient instructions, address, and phone number to the [facility].  Pt will pick up a nebulizer from [facility].

## 2019-03-22 NOTE — Progress Notes (Signed)
RE: Collin Cabrera MRN: [id number] DOB: [birth year] Date of Telemedicine Visit: 03/22/2019  Referring provider: No ref. provider found Primary care provider: Patient, No Pcp Per  Chief Complaint: Asthma (currently taking prednisone,)   Telemedicine Follow Up Visit via Telephone: I connected with Collin Cabrera for a follow up on 03/23/19 by telephone and verified that I am speaking with the correct person using two identifiers.   I discussed the limitations, risks, security and privacy concerns of performing an evaluation and management service by telephone and the availability of in person appointments. I also discussed with the patient that there may be a patient responsible charge related to this service. The patient expressed understanding and agreed to proceed.  Patient is at home accompanied by no one who provided/contributed to the history.  Provider is at the office.  Visit start time: 2:35 PM Visit end time: 3:06 PM Insurance consent/check in by: [employer] consent and medical assistant/nurse: Trina  History of Present Illness: He is a 43 y.o. male, who is being followed for persistent asthma. His previous allergy office visit was in February 2020 with Dr. Ernst Bowler.  He was last seen at the end of February for a sick visit.  He tends only show up when he is sick.  I did recommend that he restart his Dulera 2 puffs in the morning and 2 puffs at night and continue for 2 weeks consistently.  We did start him on a prednisone dose pack.   Since the last visit, he has continued to have problems. He reports that his symptoms were not as bad as in February. He has had problems for the entire month of March. The last two weeks in particular have been difficult, with him using albuterol 6-8 times a day.  The use of the albuterol did help with his symptoms.  He does feel that the prednisone helped somewhat and overall he is improving.  He is now using his inhaler around 4 times a day.   However, typically uses 0 times per day.  For instance, he went through October, November, and December without using anything once.  He has been mildly febrile over the last couple of days.  He estimates that his T-max is been 100 which he treated with aspirin with improvement.  He does endorse some body aches and chills as well.  He also endorses shortness of breath without chest pain.  He reports low energy and fatigue, but he thinks some of this might be related to prednisone which tends to make him agitated.  Saturday is his last dose of prednisone this week.  He denies any new exposures.  He works in a computer all day and has not traveled out of the state in over 3 months.  He has not been out of the country at all in over 6 months or more.  He has not gotten a chest x-ray during this entire time.  Otherwise, there have been no changes to his past medical history, surgical history, family history, or social history.  Assessment and Plan:  Collin Cabrera is a 43 y.o. male with:  Mild persistent asthma with acute exacerbation  Chronic rhinitis (dust mites, historically)  Recurrent infections - isolated to the sinuses   1. Mild persistent asthma with acute exacerbation -I am not entirely sure what is going on, but given the new onset of fever I would like to get a chest x-ray. -Chest x-ray ordered for [facility]. -We will call him with  the results. -I did recommend that he continue on the [facility] 2 puffs twice daily and try to wean albuterol as he is doing. -He has been on prednisone for now 2 weeks, and I will not extend it at this time. -I will check in on him later this week to make sure he is turning the corner. -I deferred influenza testing since symptoms have already been going on for 48 hours or more and treatment would not change my management. -I think he is a low risk for COVID-19 given the lack of travel as well as the low-grade fever.  2. Chronic rhinitis (dust  mites) - Continue with the Flonase as needed.  3. Return in about 3 months (around 06/21/2019).   Diagnostics: None.  Medication List:  Current Outpatient Medications  Medication Sig Dispense Refill  . budesonide-formoterol (SYMBICORT) 160-4.5 MCG/ACT inhaler Inhale 2 puffs into the lungs 2 (two) times daily. 1 Inhaler 5  . diazepam (VALIUM) 5 MG tablet Take 1 tablet by mouth daily as needed.    . fluticasone (FLONASE) 50 MCG/ACT nasal spray Place 1 spray into both nostrils daily. 16 g 3  . levocetirizine (XYZAL) 5 MG tablet Once daily  11  . predniSONE (DELTASONE) 10 MG tablet Take 3 tabs (30mg ) twice daily for 3 days, then 2 tabs (20mg ) twice daily for 3 days, then 1 tab (10mg ) twice daily for 3 days, then STOP. 36 tablet 0  . PROAIR HFA 108 (90 Base) MCG/ACT inhaler Inhale 2 puffs into the lungs every 6 (six) hours as needed for wheezing or shortness of breath. 2 puffs every 6 hrs prn 1 Inhaler 1  . traZODone (DESYREL) 50 MG tablet Take 50 mg by mouth at bedtime.    Marland Kitchen albuterol (PROVENTIL) (2.5 MG/3ML) 0.083% nebulizer solution Take 3 mLs (2.5 mg total) by nebulization every 4 (four) hours as needed for wheezing or shortness of breath. 150 mL 1   No current facility-administered medications for this visit.    Allergies: Allergies  Allergen Reactions  . Dust Mite Extract    I reviewed his past medical history, social history, family history, and environmental history and no significant changes have been reported from previous visits.  Review of Systems  Constitutional: Positive for fever. Negative for chills, diaphoresis and fatigue.  HENT: Negative for congestion, ear discharge, ear pain, facial swelling, postnasal drip, rhinorrhea, sinus pressure, sinus pain and sore throat.   Eyes: Negative for pain, discharge and itching.  Respiratory: Positive for cough and shortness of breath. Negative for apnea and chest tightness.   Cardiovascular: Negative for chest pain.   Gastrointestinal: Negative for diarrhea and nausea.  Musculoskeletal: Negative for arthralgias and myalgias.  Skin: Negative for rash.  Allergic/Immunologic: Negative for environmental allergies and food allergies.     Objective:  Physical exam not obtained as encounter was done via telephone.   Previous notes and tests were reviewed.  I discussed the assessment and treatment plan with the patient. The patient was provided an opportunity to ask questions and all were answered. The patient agreed with the plan and demonstrated an understanding of the instructions.   The patient was advised to call back or seek an in-person evaluation if the symptoms worsen or if the condition fails to improve as anticipated.  I provided 31 minutes of non-face-to-face time during this encounter.  It was my pleasure to participate in Collin Cabrera care today. Please feel free to contact me with any questions or concerns.   Sincerely,  Valentina Shaggy, MD

## 2019-03-23 ENCOUNTER — Telehealth: Payer: Self-pay | Admitting: *Deleted

## 2019-03-23 ENCOUNTER — Encounter: Payer: Self-pay | Admitting: Allergy & Immunology

## 2019-03-23 ENCOUNTER — Ambulatory Visit (HOSPITAL_BASED_OUTPATIENT_CLINIC_OR_DEPARTMENT_OTHER)
Admission: RE | Admit: 2019-03-23 | Discharge: 2019-03-23 | Disposition: A | Discharge status: Home / Self Care | Payer: 59 | Source: Ambulatory Visit | Attending: Allergy & Immunology | Admitting: Allergy & Immunology

## 2019-03-23 DIAGNOSIS — R05 Cough: Secondary | ICD-10-CM | POA: Diagnosis not present

## 2019-03-23 DIAGNOSIS — R059 Cough, unspecified: Secondary | ICD-10-CM

## 2019-03-23 NOTE — Patient Instructions (Addendum)
1. Mild persistent asthma with acute exacerbation -I am not entirely sure what is going on, but given the new onset of fever I would like to get a chest x-ray. -Chest x-ray ordered for [facility]. -We will call him with the results. -I did recommend that he continue on the [facility] 2 puffs twice daily and try to wean albuterol as he is doing. -He has been on prednisone for now 2 weeks, and I will not extend it at this time. -I will check in on him later this week to make sure he is turning the corner. -I deferred influenza testing since symptoms have already been going on for 48 hours or more and treatment would not change my management. -I think he is a low risk for COVID-19 given the lack of travel as well as the low-grade fever.  2. Chronic rhinitis (dust mites) - Continue with the Flonase as needed.  3. Return in about 3 months (around 06/21/2019).   Please inform us of any Emergency Department visits, hospitalizations, or changes in symptoms. Call us before going to the ED for breathing or allergy symptoms since we might be able to fit you in for a sick visit. Feel free to contact us anytime with any questions, problems, or concerns.  It was a pleasure to talk to you again today!  Websites that have reliable patient information: 1. American Academy of Asthma, Allergy, and Immunology: www.aaaai.org 2. Food Allergy Research and Education (FARE): foodallergy.org 3. Mothers of Asthmatics: http://www.asthmacommunitynetwork.org 4. American College of Allergy, Asthma, and Immunology: MonthlyElectricBill.co.uk   Make sure you are registered to vote! If you have moved or changed any of your contact information, you will need to get this updated before voting!    Voter ID laws are NOT going into effect for the General Election in November 2020! DO NOT let this stop you from exercising your right to vote!

## 2019-03-23 NOTE — Telephone Encounter (Signed)
-----   Message from Valentina Shaggy, MD sent at 03/23/2019  7:05 AM EDT ----- Can someone call to see if he got his CXR? I did not see it in the system.

## 2019-03-23 NOTE — Telephone Encounter (Signed)
Called patient to confirm his appointment for chest x-ray at [place].   Order has been changed by Dr. Ernst Bowler as stat order to be done today.  Patient states he has not had any fever today and that last day he had fever was Saturday and it was low grade.

## 2019-03-23 NOTE — Addendum Note (Signed)
Addended by: Valentina Shaggy on: 03/23/2019 09:25 AM   Modules accepted: Orders

## 2019-03-23 NOTE — Telephone Encounter (Signed)
Spoke with patient and he states he is working and will try and go get the chest xray this afternoon.

## 2019-08-06 IMAGING — DX CHEST - 2 VIEW
2 series · 2 of 2 positions shown · non-contrast
Comparison: Radiographs March 08, 2015.

CLINICAL DATA: Cough, fever.

EXAM:
CHEST - 2 VIEW

[chest pa]
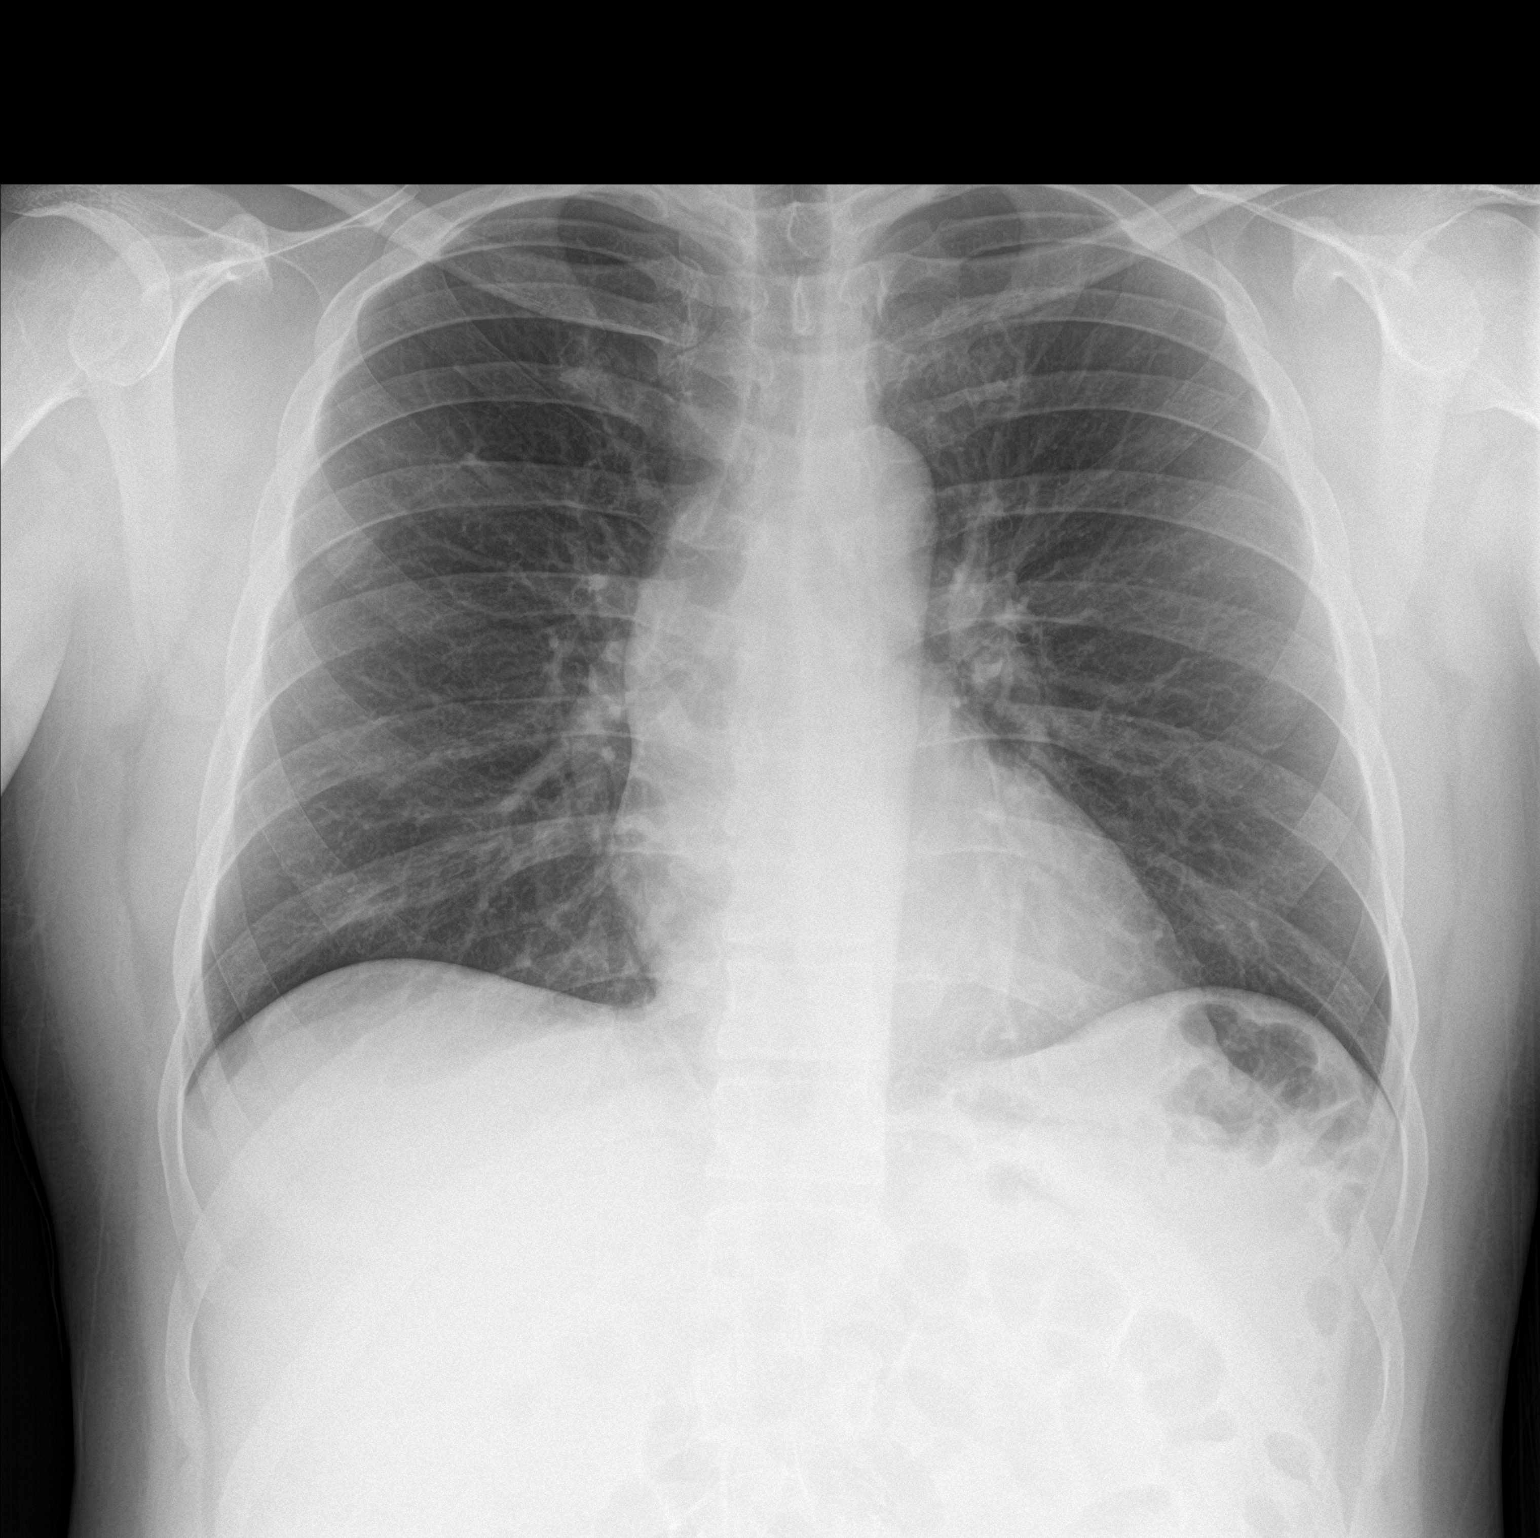

[chest lat]
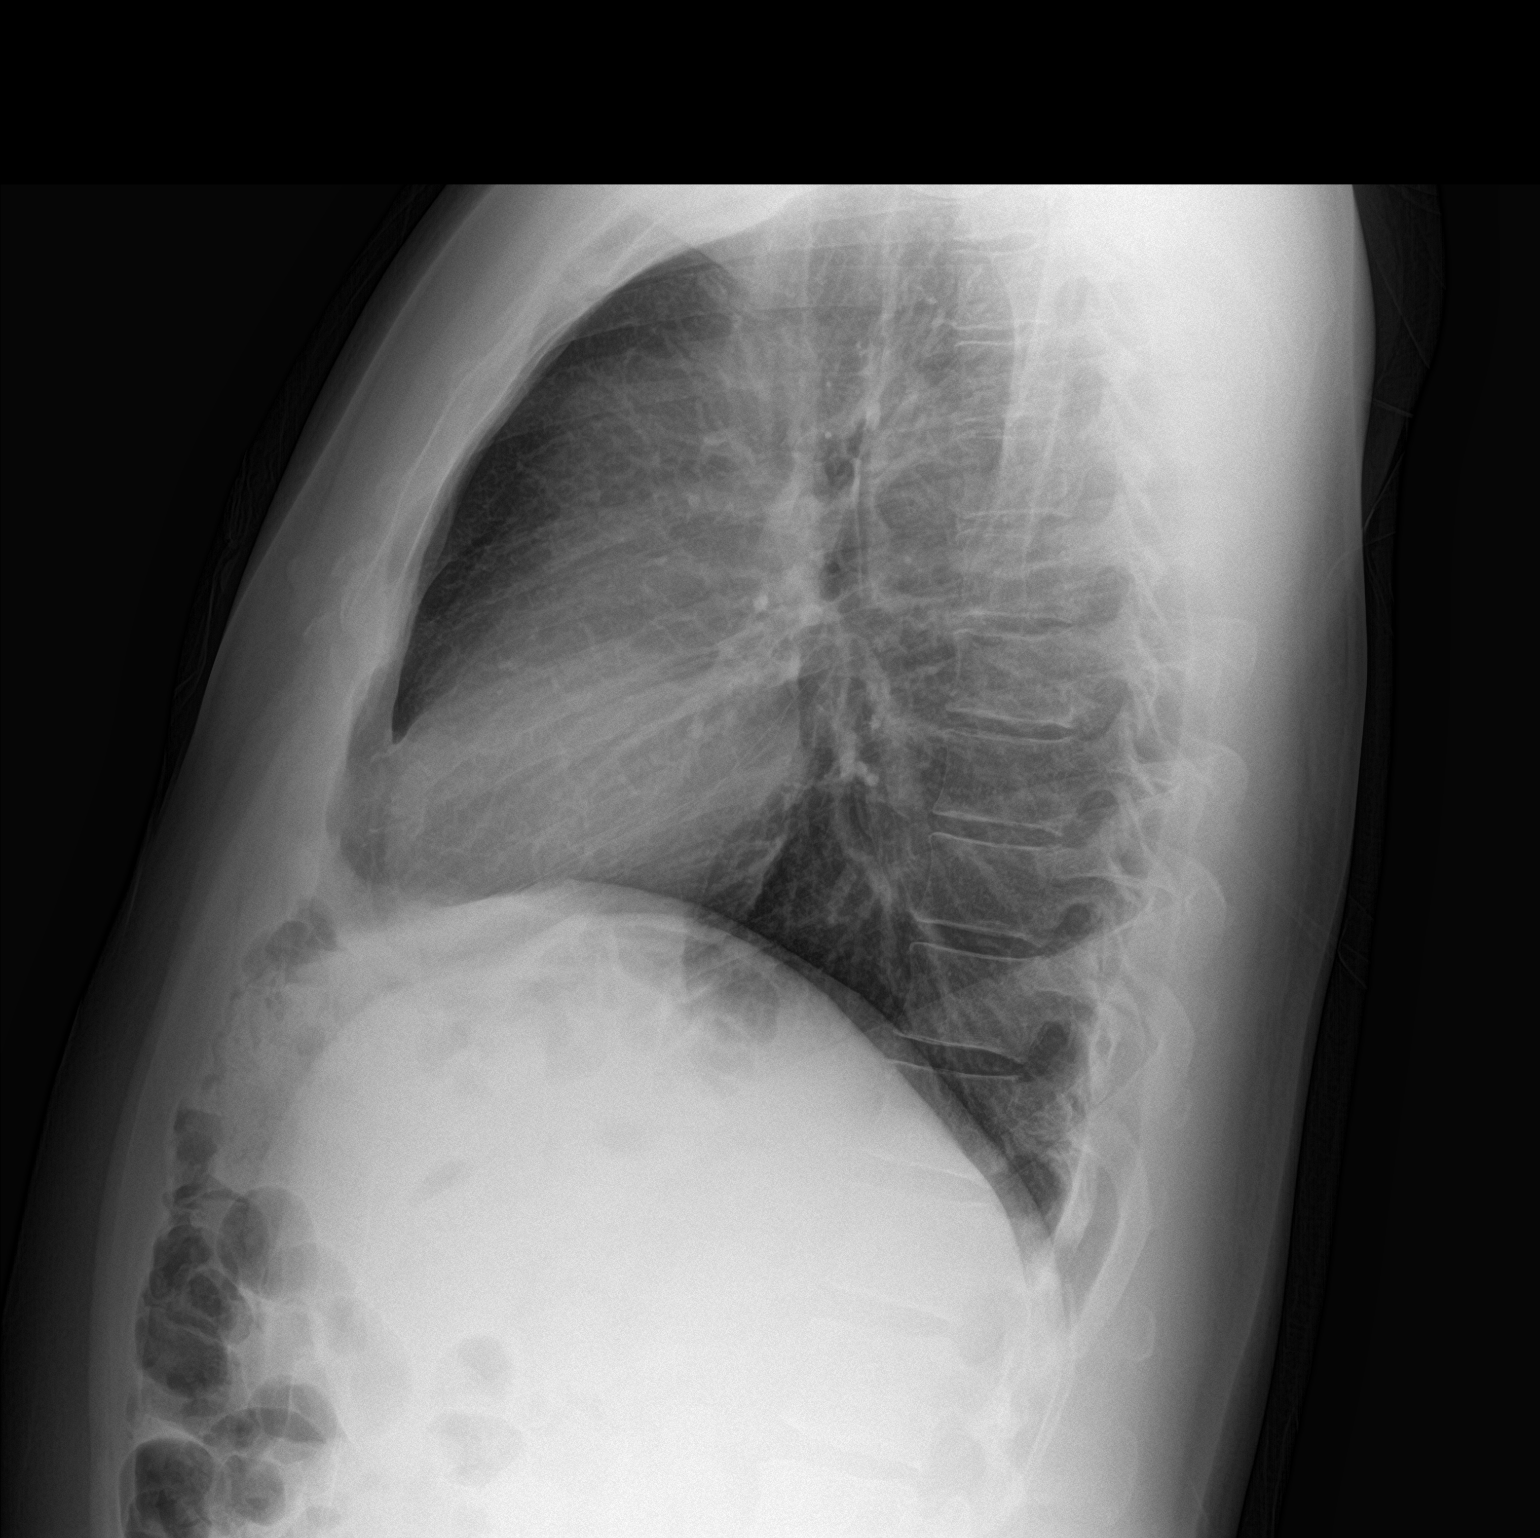

[2 of 2 positions shown; findings below may reference images not displayed]

FINDINGS: The heart size and mediastinal contours are within normal limits.
Both lungs are clear. The visualized skeletal structures are
unremarkable.
IMPRESSION: No active cardiopulmonary disease.

## 2019-12-09 ENCOUNTER — Other Ambulatory Visit: Payer: Self-pay | Admitting: Allergy & Immunology

## 2020-01-17 ENCOUNTER — Other Ambulatory Visit: Payer: Self-pay

## 2020-01-17 ENCOUNTER — Encounter: Payer: Self-pay | Admitting: Allergy & Immunology

## 2020-01-17 ENCOUNTER — Ambulatory Visit: Payer: 59 | Admitting: Allergy and Immunology

## 2020-01-17 ENCOUNTER — Ambulatory Visit (INDEPENDENT_AMBULATORY_CARE_PROVIDER_SITE_OTHER): Payer: BC Managed Care – PPO | Admitting: Allergy & Immunology

## 2020-01-17 VITALS — BP 120/84 | HR 64 | Temp 97.5°F | Resp 16 | Ht 69.0 in | Wt 181.6 lb

## 2020-01-17 DIAGNOSIS — J4541 Moderate persistent asthma with (acute) exacerbation: Secondary | ICD-10-CM

## 2020-01-17 DIAGNOSIS — J310 Chronic rhinitis: Secondary | ICD-10-CM | POA: Diagnosis not present

## 2020-01-17 MED ORDER — BUDESONIDE-FORMOTEROL FUMARATE 160-4.5 MCG/ACT IN AERO: 2.0000 | INHALATION_SPRAY | Freq: Two times a day (BID) | RESPIRATORY_TRACT | 5 refills | Status: AC

## 2020-01-17 NOTE — Patient Instructions (Addendum)
1. Mild persistent asthma with acute exacerbation - Lung testing looked decent today. - We are starting a prednisone taper today. - Start the Symbicort two puffs twice daily. - Spacer use reviewed. - Daily controller medication(s): Symbicort 160/4.17mcg two puffs twice daily with spacer - Prior to physical activity: albuterol 2 puffs 10-15 minutes before physical activity. - Rescue medications: albuterol 4 puffs every 4-6 hours as needed - Asthma control goals:  * Full participation in all desired activities (may need albuterol before activity) * Albuterol use two time or less a week on average (not counting use with activity) * Cough interfering with sleep two time or less a month * Oral steroids no more than once a year * No hospitalizations  2. Chronic rhinitis (dust mites) - Continue with the Flonase as needed.  3. Return in about 4 months (around 05/16/2020). This can be an in-person, a virtual Webex or a telephone follow up visit.   Please inform us of any Emergency Department visits, hospitalizations, or changes in symptoms. Call us before going to the ED for breathing or allergy symptoms since we might be able to fit you in for a sick visit. Feel free to contact us anytime with any questions, problems, or concerns.  It was a pleasure to see you again today!  Websites that have reliable patient information: 1. American Academy of Asthma, Allergy, and Immunology: www.aaaai.org 2. Food Allergy Research and Education (FARE): foodallergy.org 3. Mothers of Asthmatics: http://www.asthmacommunitynetwork.org 4. American College of Allergy, Asthma, and Immunology: www.acaai.org   COVID-19 Vaccine Information can be found at: ShippingScam.co.uk For questions related to vaccine distribution or appointments, please email [email or web address] or call [phone].     "Like" [place] on Facebook and Instagram for our latest updates!          Make sure you are registered to vote! If you have moved or changed any of your contact information, you will need to get this updated before voting!  In some cases, you MAY be able to register to vote online: [email or web address]

## 2020-01-17 NOTE — Progress Notes (Signed)
FOLLOW UP  Date of Service/Encounter:  01/17/20   Assessment:   Moderate persistent asthma with acute exacerbation  Chronic rhinitis(dust mites, historically)  Recurrent infections- isolated to the sinuses  History of nasal polyps - s/p two polypectomies    Plan/Recommendations:   1. Mild persistent asthma with acute exacerbation - Lung testing looked decent today. - We are starting a prednisone taper today. - Start the Symbicort two puffs twice daily. - Spacer use reviewed. - Daily controller medication(s): Symbicort 160/4.76mcg two puffs twice daily with spacer - Prior to physical activity: albuterol 2 puffs 10-15 minutes before physical activity. - Rescue medications: albuterol 4 puffs every 4-6 hours as needed - Asthma control goals:  * Full participation in all desired activities (may need albuterol before activity) * Albuterol use two time or less a week on average (not counting use with activity) * Cough interfering with sleep two time or less a month * Oral steroids no more than once a year * No hospitalizations  2. Chronic rhinitis (dust mites) - Continue with the Flonase as needed.  3. Return in about 4 months (around 05/16/2020). This can be an in-person, a virtual Webex or a telephone follow up visit.  Subjective:   Collin Cabrera is a 44 y.o. male presenting today for follow up of  Chief Complaint  Patient presents with  . Asthma    using albuterol at least 4x per day, having wheezing and SOB for the last 3 months    Collin Cabrera has a history of the following: Patient Active Problem List   Diagnosis Date Noted  . Asthma with acute exacerbation 02/17/2017  . Other allergic rhinitis 02/17/2017  . History of nasal polyp 02/17/2017  . Asthma, moderate persistent 03/08/2015  . Sinusitis, chronic 03/08/2015    History obtained from: chart review and patient.  Collin Cabrera is a 44 y.o. male presenting for a sick visit.  He was last seen in March  2020.  At that time, he was evaluated for continued asthma symptoms.  He did develop a fever as well so we got a chest x-ray.  We recommended that he continue Dulera 2 puffs twice daily.  He had already been on prednisone for 2 weeks.  For his rhinitis, we continue with Flonase.  The chest x-ray was completely negative.  Since last visit, he did well initially. The prednisone always seems to make his symptoms better and he remains fairly well controlled for almost a year before he needs prednisone again.  This has been his typical course.  He has never remained on his controller medication as recommended.  However, over the last year, he has developed worsening and more frequent episodes of shortness of breath and coughing.  These episodes are responsive to albuterol.  He has not needed prednisone, but he tells me that he is using his rescue inhaler around 3 or 4 times per day at this point. He is open to using a controller medication at this time, although he has been hesitant to this in the past.  He denies any new exposures.  He does seem clearly worried about the worsening of his asthma symptoms.  It hard to tell whether he is just having more insight into his symptoms or if his symptoms are definitely taken a turn for the worse.  He last used his albuterol around 2 hours before this appointment, which likely explains his excellent spirometry.  At this point, he has had 2 to 3 weeks of using his  albuterol multiple times per day.  He denies any fevers.  He denies any loss of smell or taste. He has not have any COVID19 exposures.   Of note, he does have a history of nasal polyps and has had two surgeries. He does not feel that his symptoms are related to worsening polyps, as exacerbations of his more often present with worsening sinusitis episodes. He has not been treated for sinusitis in years.   Otherwise, there have been no changes to his past medical history, surgical history, family history, or  social history.    Review of Systems  Constitutional: Negative.  Negative for chills, fever, malaise/fatigue and weight loss.  HENT: Negative.  Negative for congestion, ear discharge and ear pain.   Eyes: Negative for pain, discharge and redness.  Respiratory: Positive for cough and wheezing. Negative for sputum production and shortness of breath.   Cardiovascular: Negative.  Negative for chest pain and palpitations.  Gastrointestinal: Negative for abdominal pain, constipation, diarrhea, heartburn, nausea and vomiting.  Skin: Negative.  Negative for itching and rash.  Neurological: Negative for dizziness and headaches.  Endo/Heme/Allergies: Negative for environmental allergies. Does not bruise/bleed easily.       Objective:   Blood pressure 120/84, pulse 64, temperature (!) 97.5 F (36.4 C), temperature source Temporal, resp. rate 16, height 5\' 9"  (1.753 m), weight 181 lb 9.6 oz (82.4 kg), SpO2 96 %. Body mass index is 26.82 kg/m.   Physical Exam:  Physical Exam  Constitutional: He appears well-developed.  Moving air well in all lung fields. No increased work of breathing noted.   HENT:  Head: Normocephalic and atraumatic.  Right Ear: Tympanic membrane, external ear and ear canal normal.  Left Ear: Tympanic membrane, external ear and ear canal normal.  Nose: Mucosal edema and rhinorrhea present. No nasal deformity or septal deviation. No epistaxis. Right sinus exhibits no maxillary sinus tenderness and no frontal sinus tenderness. Left sinus exhibits no maxillary sinus tenderness and no frontal sinus tenderness.  Mouth/Throat: Uvula is midline and oropharynx is clear and moist. Mucous membranes are not pale and not dry.  Tonsils normal bilaterally. No exudates.   Eyes: Pupils are equal, round, and reactive to light. Conjunctivae and EOM are normal. Right eye exhibits no chemosis and no discharge. Left eye exhibits no chemosis and no discharge. Right conjunctiva is not injected.  Left conjunctiva is not injected.  Cardiovascular: Normal rate, regular rhythm and normal heart sounds.  Respiratory: Effort normal and breath sounds normal. No accessory muscle usage. No tachypnea. No respiratory distress. He has no wheezes. He has no rhonchi. He has no rales. He exhibits no tenderness.  Moving air well in all lung fields. No increased work of breathing noted.   Lymphadenopathy:    He has no cervical adenopathy.  Neurological: He is alert.  Skin: No abrasion, no petechiae and no rash noted. Rash is not papular, not vesicular and not urticarial. No erythema. No pallor.  Psychiatric: He has a normal mood and affect.     Diagnostic studies:    Spirometry: results normal (FEV1: 3.98/99%, FVC: 5.00/99%, FEV1/FVC: 80%).    Spirometry consistent with normal pattern.   Allergy Studies: none           Salvatore Marvel, MD  [facility]

## 2020-02-05 ENCOUNTER — Other Ambulatory Visit: Payer: Self-pay | Admitting: Allergy & Immunology

## 2020-02-06 ENCOUNTER — Telehealth: Payer: Self-pay | Admitting: *Deleted

## 2020-02-06 ENCOUNTER — Other Ambulatory Visit: Payer: Self-pay | Admitting: Allergy & Immunology

## 2020-02-06 MED ORDER — ADVAIR HFA 115-21 MCG/ACT IN AERO: 2.0000 | INHALATION_SPRAY | Freq: Two times a day (BID) | RESPIRATORY_TRACT | 5 refills | Status: AC

## 2020-02-06 NOTE — Telephone Encounter (Signed)
Received a fax from Pharmacy stating that Symbicort 160 is not covered by insurance, but Fluticasone Propion 250, Breo 100, Dulera 200, Advair HFA 115, Flovent HFA 110, Trelegy 100, and Breztri Aerosphere are preferred. Please advise change in inhaler.

## 2020-02-06 NOTE — Telephone Encounter (Signed)
Per verbal order from Dr. Ernst Bowler he wants Advair 115 for 2 puffs 2 times daily. Prescription has been sent to pharmacy. Called and advised patient of change in inhaler. Patient verbalized understanding.

## 2020-02-06 NOTE — Addendum Note (Signed)
Addended by: Chip Boer R on: 02/06/2020 01:44 PM   Modules accepted: Orders
